# Patient Record
Sex: Female | Born: 1995 | Race: White | Hispanic: No | Marital: Single | Smoking: Never smoker
Health system: Southern US, Community
[De-identification: ages and names within clinical notes are randomized; demographics above are authoritative.]

## PROBLEM LIST (undated history)

## (undated) HISTORY — PX: SYMPATHECTOMY: SHX792

---
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
## 2015-07-18 IMAGING — CR DG ANKLE COMPLETE 3+V*L*
4 series · 4 of 4 positions shown · non-contrast
Comparison: None.

CLINICAL DATA: Left ankle pain.  Twisting injury.

EXAM:
LEFT ANKLE COMPLETE - 3+ VIEW

[AP]
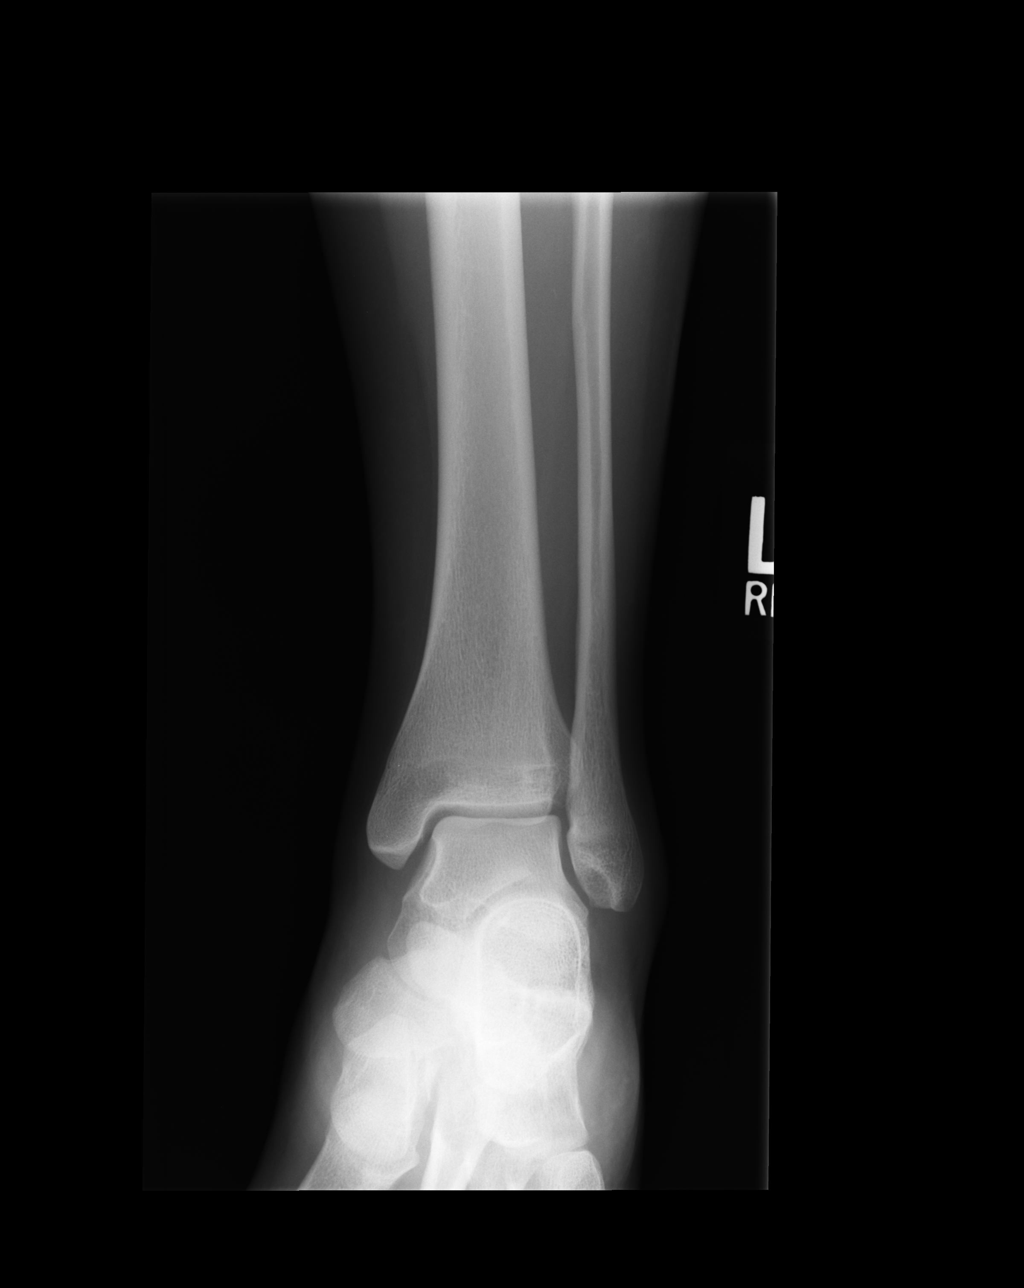

[ap obl int rot (1 of 2)]
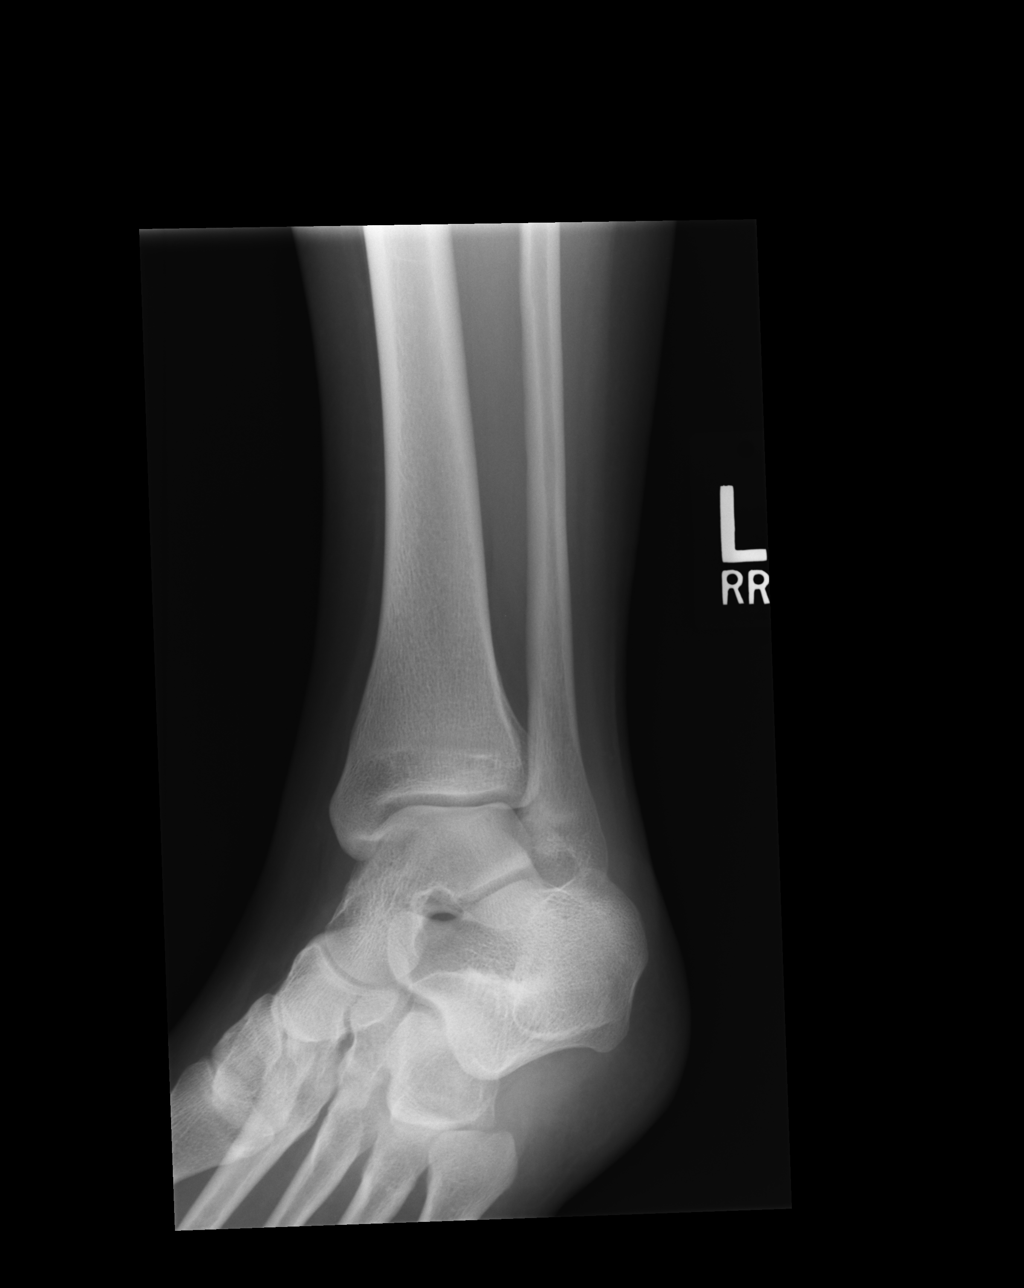

[ap obl int rot (2 of 2)]
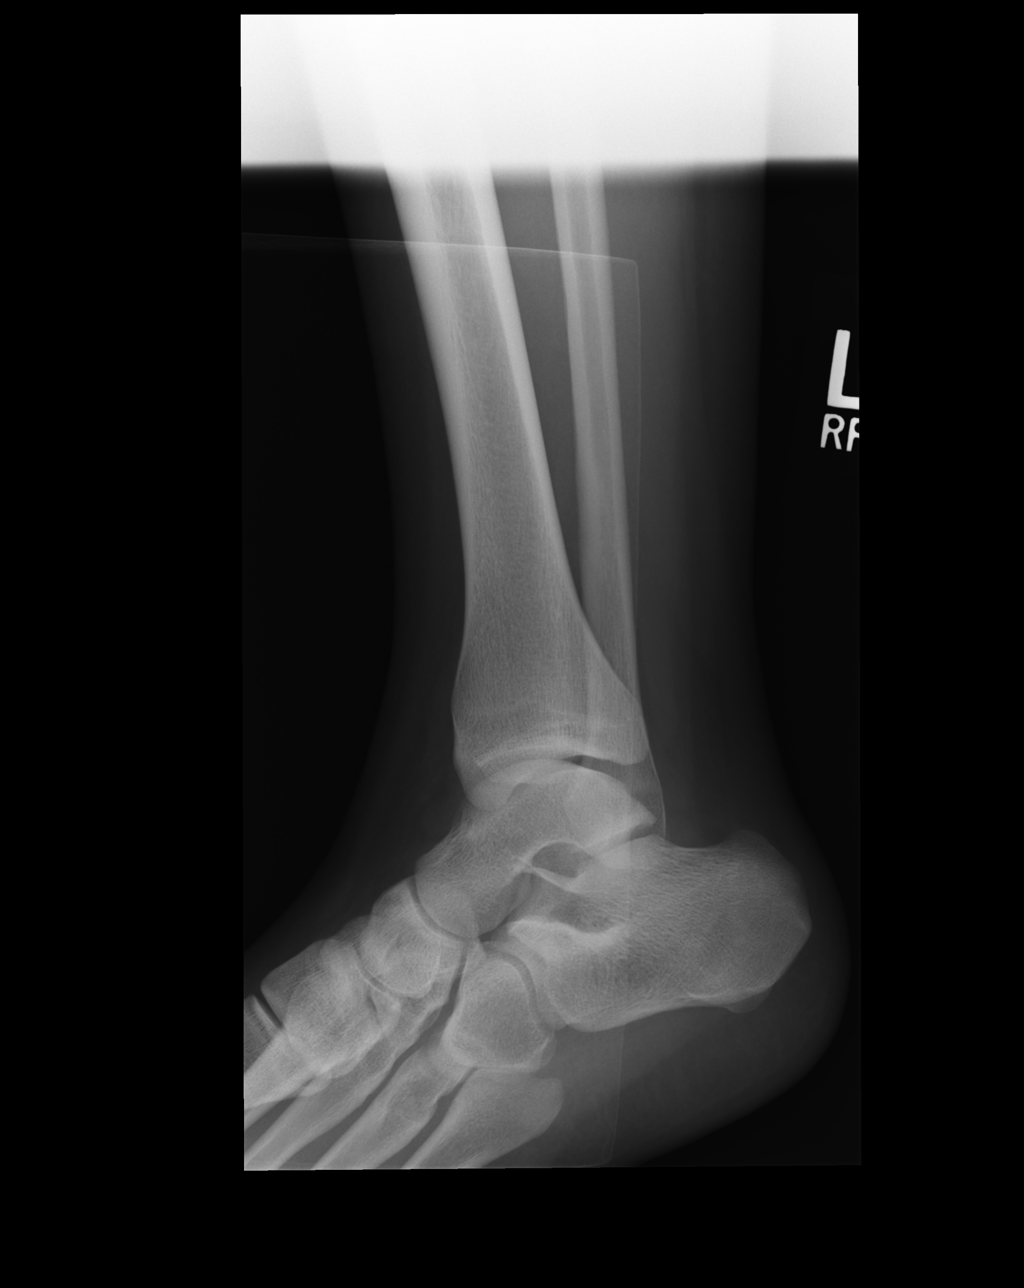

[lateral]
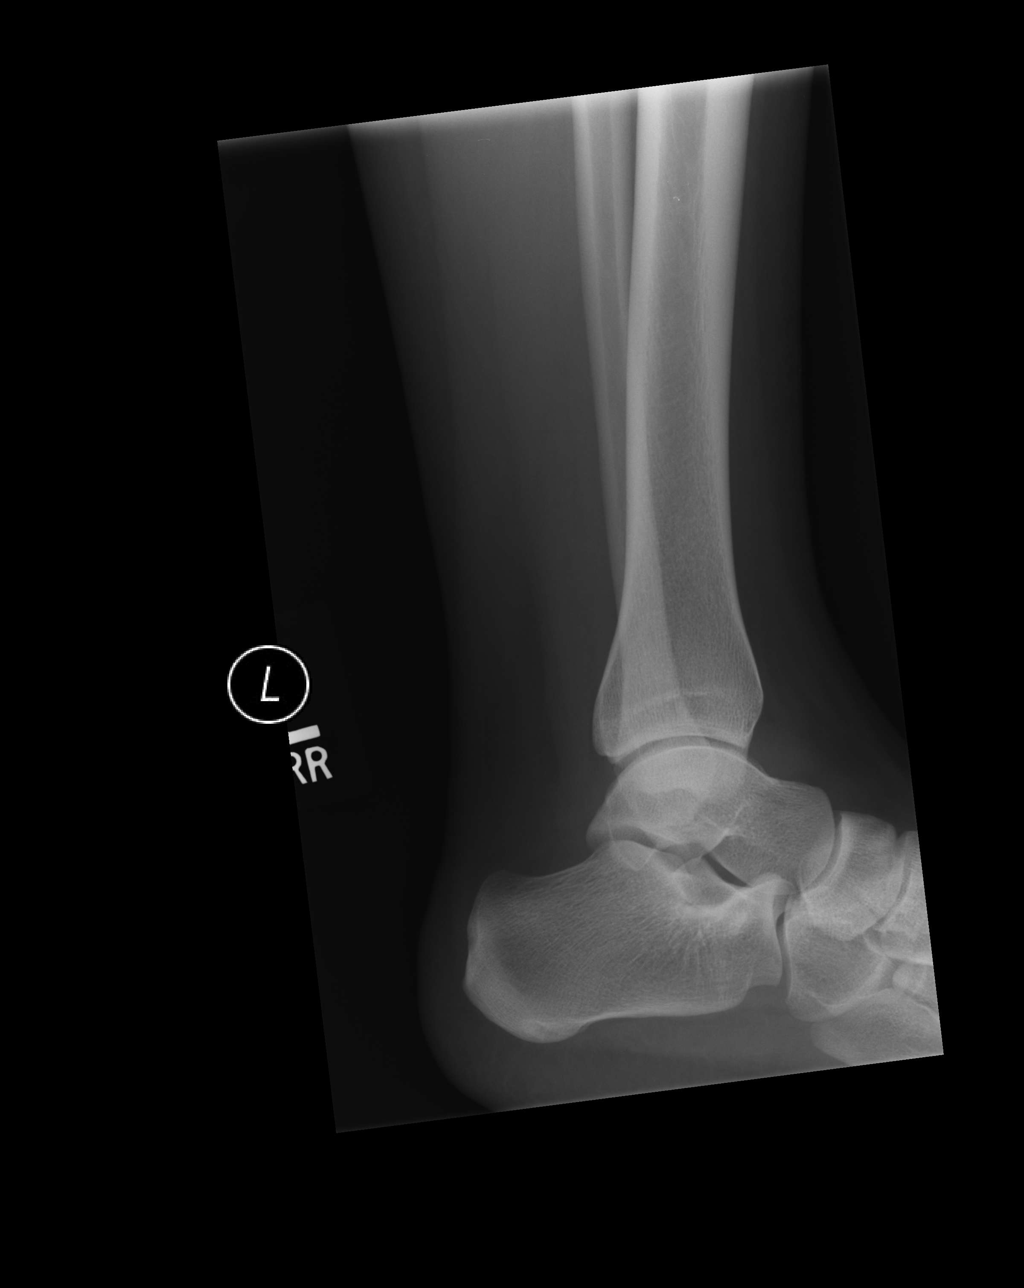

[4 of 4 positions shown; findings below may reference images not displayed]

FINDINGS: There is no evidence of fracture, dislocation, or joint effusion.
There is no evidence of arthropathy or other focal bone abnormality.
Mild soft tissue swelling.
IMPRESSION: No fracture or ankle joint abnormality
